# Patient Record
Sex: Male | Born: 1995 | Race: White | Hispanic: No | Marital: Married | Smoking: Never smoker
Health system: Southern US, Community
[De-identification: ages and names within clinical notes are randomized; demographics above are authoritative.]

## PROBLEM LIST (undated history)

## (undated) DIAGNOSIS — S060XAA Concussion with loss of consciousness status unknown, initial encounter: Secondary | ICD-10-CM

## (undated) DIAGNOSIS — S060X9A Concussion with loss of consciousness of unspecified duration, initial encounter: Secondary | ICD-10-CM

## (undated) HISTORY — PX: TYMPANOSTOMY TUBE PLACEMENT: SHX32

---
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
## 2015-12-27 DIAGNOSIS — J020 Streptococcal pharyngitis: Secondary | ICD-10-CM | POA: Diagnosis not present

## 2016-03-23 ENCOUNTER — Encounter (HOSPITAL_COMMUNITY): Payer: Self-pay | Admitting: Emergency Medicine

## 2016-03-23 ENCOUNTER — Emergency Department (HOSPITAL_COMMUNITY)
Admission: EM | Admit: 2016-03-23 | Discharge: 2016-03-23 | Disposition: A | Discharge status: Home / Self Care | Payer: Worker's Compensation | Attending: Emergency Medicine | Admitting: Emergency Medicine

## 2016-03-23 DIAGNOSIS — Y999 Unspecified external cause status: Secondary | ICD-10-CM | POA: Diagnosis not present

## 2016-03-23 DIAGNOSIS — S060X0A Concussion without loss of consciousness, initial encounter: Secondary | ICD-10-CM | POA: Insufficient documentation

## 2016-03-23 DIAGNOSIS — Y929 Unspecified place or not applicable: Secondary | ICD-10-CM | POA: Diagnosis not present

## 2016-03-23 DIAGNOSIS — S0990XA Unspecified injury of head, initial encounter: Secondary | ICD-10-CM | POA: Diagnosis present

## 2016-03-23 DIAGNOSIS — W228XXA Striking against or struck by other objects, initial encounter: Secondary | ICD-10-CM | POA: Insufficient documentation

## 2016-03-23 DIAGNOSIS — Z79899 Other long term (current) drug therapy: Secondary | ICD-10-CM | POA: Insufficient documentation

## 2016-03-23 DIAGNOSIS — Y9389 Activity, other specified: Secondary | ICD-10-CM | POA: Diagnosis not present

## 2016-03-23 HISTORY — DX: Concussion with loss of consciousness status unknown, initial encounter: S06.0XAA

## 2016-03-23 HISTORY — DX: Concussion with loss of consciousness of unspecified duration, initial encounter: S06.0X9A

## 2016-03-23 MED ORDER — ONDANSETRON 4 MG PO TBDP
4.0000 mg | ORAL_TABLET | Freq: Once | ORAL | Status: AC
  Administered 2016-03-23: 4 mg via ORAL
  Filled 2016-03-23: qty 1

## 2016-03-23 MED ORDER — ONDANSETRON HCL 4 MG PO TABS: 4.0000 mg | ORAL_TABLET | Freq: Four times a day (QID) | ORAL | 0 refills | Status: DC

## 2016-03-23 NOTE — Discharge Instructions (Signed)

## 2016-03-23 NOTE — ED Notes (Signed)
Pt reports attempting to do a flip from a standing position and falling on top of head at approximately 9:15 pm last night. Pt reports having blurred vision initially but going to bed and feeling better this am. Pt began having intermittent dizziness/nausea and generally feeling unwell at approximately 930 am. Pt reports 1 episode vomiting at 1 pm today. Pt denies sx at present, but states they have been coming and going very frequently.
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
## 2016-03-23 NOTE — ED Triage Notes (Signed)
Pt reports attempted to do a back flip and reports landed on head. Pt denies loc. Pt reports intermittent head pain, dizziness. Pt reports emesis x1 this am. Pt alert and oriented. nad noted.

## 2016-08-19 DIAGNOSIS — L814 Other melanin hyperpigmentation: Secondary | ICD-10-CM | POA: Diagnosis not present

## 2016-08-19 DIAGNOSIS — D235 Other benign neoplasm of skin of trunk: Secondary | ICD-10-CM | POA: Diagnosis not present

## 2016-12-16 DIAGNOSIS — Z23 Encounter for immunization: Secondary | ICD-10-CM | POA: Diagnosis not present

## 2017-08-18 DIAGNOSIS — D235 Other benign neoplasm of skin of trunk: Secondary | ICD-10-CM | POA: Diagnosis not present

## 2017-11-05 DIAGNOSIS — H9311 Tinnitus, right ear: Secondary | ICD-10-CM | POA: Diagnosis not present

## 2017-11-05 DIAGNOSIS — Z6827 Body mass index (BMI) 27.0-27.9, adult: Secondary | ICD-10-CM | POA: Diagnosis not present

## 2017-12-06 ENCOUNTER — Ambulatory Visit (INDEPENDENT_AMBULATORY_CARE_PROVIDER_SITE_OTHER): Payer: BLUE CROSS/BLUE SHIELD | Admitting: Otolaryngology

## 2017-12-06 DIAGNOSIS — H9313 Tinnitus, bilateral: Secondary | ICD-10-CM

## 2017-12-06 DIAGNOSIS — H93293 Other abnormal auditory perceptions, bilateral: Secondary | ICD-10-CM

## 2018-02-15 DIAGNOSIS — Z0000 Encounter for general adult medical examination without abnormal findings: Secondary | ICD-10-CM | POA: Diagnosis not present

## 2018-02-15 DIAGNOSIS — Z6826 Body mass index (BMI) 26.0-26.9, adult: Secondary | ICD-10-CM | POA: Diagnosis not present

## 2018-03-20 DIAGNOSIS — J029 Acute pharyngitis, unspecified: Secondary | ICD-10-CM | POA: Diagnosis not present

## 2018-06-16 DIAGNOSIS — Z23 Encounter for immunization: Secondary | ICD-10-CM | POA: Diagnosis not present

## 2018-09-28 DIAGNOSIS — L570 Actinic keratosis: Secondary | ICD-10-CM | POA: Diagnosis not present

## 2018-09-28 DIAGNOSIS — D235 Other benign neoplasm of skin of trunk: Secondary | ICD-10-CM | POA: Diagnosis not present

## 2022-01-23 ENCOUNTER — Encounter: Payer: Self-pay | Admitting: Orthopedic Surgery

## 2022-01-23 ENCOUNTER — Ambulatory Visit (INDEPENDENT_AMBULATORY_CARE_PROVIDER_SITE_OTHER): Payer: No Typology Code available for payment source | Admitting: Orthopedic Surgery

## 2022-01-23 VITALS — BP 153/113 | HR 81 | Ht 71.0 in | Wt 220.0 lb

## 2022-01-23 DIAGNOSIS — G5761 Lesion of plantar nerve, right lower limb: Secondary | ICD-10-CM | POA: Diagnosis not present

## 2022-01-23 NOTE — Progress Notes (Signed)
New Patient Visit  Assessment: Sean Campos is a 26 y.o. male with the following: 1. Morton's neuroma of right foot  Plan: Sean Campos has pain and swelling in his right foot.  No injury.  Pain is worsened with running.  Tenderness within 2nd and 3rd webspace.  Positive Mulder's click on physical.  Description consistent with Morton's neuroma.  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  He would like to see a foot specialist.  Refer to Dr Lajoyce Corners for full evaluation.   Follow-up: Return for referral to Dr. Lajoyce Corners.  Subjective:  Chief Complaint  Patient presents with   Foot Pain    Right toes are painful with running, is a Emergency planning/management officer     History of Present Illness: Sean Campos is a 25 y.o. male who has been referred by  Wille Glaser, PA-C for evaluation of right foot pain.  He has had pain in the right foot for the past few months.  No specific injury.  His pain is worse with running. When it is painful, he feels like he is standing on a pebble.  No issues when he wears boots at work.  Ibuprofen helps a little bit.  He is frustrated because he wants to run, but cannot.    Review of Systems: No fevers or chills No numbness or tingling No chest pain No shortness of breath No bowel or bladder dysfunction No GI distress No headaches   Medical History:  Past Medical History:  Diagnosis Date   Concussion     Past Surgical History:  Procedure Laterality Date   TYMPANOSTOMY TUBE PLACEMENT      Family History  Problem Relation Age of Onset   Heart disease Paternal Grandfather        CHF   Hypertension Mother    Diabetes Father    Hearing loss Father    Cancer Maternal Grandmother        Breast cancer   Social History   Tobacco Use   Smoking status: Never   Smokeless tobacco: Never  Substance Use Topics   Alcohol use: Yes    Comment: occasional   Drug use: Yes    Types: Marijuana    Comment: Admits to  experimenting with marijuana    Allergies  Allergen Reactions   Latex    Augmentin [Amoxicillin-Pot Clavulanate] Rash    Current Meds  Medication Sig   cetirizine (ZYRTEC) 10 MG tablet Take 10 mg by mouth daily.    Objective: BP (!) 153/113   Pulse 81   Ht 5\' 11"  (1.803 m)   Wt 220 lb (99.8 kg)   BMI 30.68 kg/m   Physical Exam:  General: Alert and oriented. Gait: Right sided antalgic gait.  Right foot with mild swelling.  Bruising over dorsum of 2nd toe.  Tenderness within the 2nd and 3rd webspace.  Positive Mulder's sign.  Sensation intact.  Active motion of the ankle and great toe.    IMAGING: I personally reviewed images previously obtained in clinic  XR of the right foot from prior clinic visit is negative.  Photos of XR under the media tab.   New Medications:  No orders of the defined types were placed in this encounter.     , MD  01/23/2022 10:17 PM

## 2022-01-23 NOTE — Patient Instructions (Signed)
Referral to Dr. Lajoyce Corners  Medications as needed  [facility] for your running shoes  Call if you have not heard from the referral in 1-2 weeks

## 2022-02-09 ENCOUNTER — Ambulatory Visit (INDEPENDENT_AMBULATORY_CARE_PROVIDER_SITE_OTHER): Payer: No Typology Code available for payment source | Admitting: Orthopedic Surgery

## 2022-02-09 DIAGNOSIS — M6701 Short Achilles tendon (acquired), right ankle: Secondary | ICD-10-CM | POA: Diagnosis not present

## 2022-02-09 DIAGNOSIS — M79671 Pain in right foot: Secondary | ICD-10-CM

## 2022-02-18 ENCOUNTER — Ambulatory Visit: Payer: Self-pay | Admitting: Allergy & Immunology

## 2022-03-18 ENCOUNTER — Ambulatory Visit: Payer: Self-pay | Admitting: Allergy & Immunology

## 2022-10-29 ENCOUNTER — Encounter: Payer: Self-pay | Admitting: Radiology
